# Patient Record
Sex: Male | Born: 1965 | Race: White | Hispanic: No | Smoking: Never smoker
Health system: Southern US, Community
[De-identification: ages and names within clinical notes are randomized; demographics above are authoritative.]

## PROBLEM LIST (undated history)

## (undated) DIAGNOSIS — J45909 Unspecified asthma, uncomplicated: Secondary | ICD-10-CM

## (undated) DIAGNOSIS — K5190 Ulcerative colitis, unspecified, without complications: Secondary | ICD-10-CM

---

## 2001-07-19 HISTORY — PX: TOTAL COLECTOMY: SHX852

## 2006-05-15 ENCOUNTER — Emergency Department: Payer: Self-pay | Admitting: Internal Medicine

## 2013-10-02 ENCOUNTER — Ambulatory Visit: Payer: Self-pay | Admitting: Family Medicine

## 2013-10-10 DIAGNOSIS — M4802 Spinal stenosis, cervical region: Secondary | ICD-10-CM | POA: Insufficient documentation

## 2015-04-29 DIAGNOSIS — J45909 Unspecified asthma, uncomplicated: Secondary | ICD-10-CM | POA: Insufficient documentation

## 2016-05-11 ENCOUNTER — Other Ambulatory Visit: Payer: Self-pay | Admitting: Family Medicine

## 2016-05-11 DIAGNOSIS — R1909 Other intra-abdominal and pelvic swelling, mass and lump: Secondary | ICD-10-CM

## 2016-05-17 ENCOUNTER — Ambulatory Visit
Admission: RE | Admit: 2016-05-17 | Discharge: 2016-05-17 | Disposition: A | Discharge status: Home / Self Care | Payer: BLUE CROSS/BLUE SHIELD | Source: Ambulatory Visit | Attending: Family Medicine | Admitting: Family Medicine

## 2016-05-17 ENCOUNTER — Ambulatory Visit: Admission: RE | Admit: 2016-05-17 | Payer: BLUE CROSS/BLUE SHIELD | Source: Ambulatory Visit

## 2016-05-17 DIAGNOSIS — K4090 Unilateral inguinal hernia, without obstruction or gangrene, not specified as recurrent: Secondary | ICD-10-CM | POA: Diagnosis not present

## 2016-05-17 DIAGNOSIS — R1909 Other intra-abdominal and pelvic swelling, mass and lump: Secondary | ICD-10-CM

## 2016-05-17 DIAGNOSIS — R1900 Intra-abdominal and pelvic swelling, mass and lump, unspecified site: Secondary | ICD-10-CM | POA: Diagnosis present

## 2017-07-26 DIAGNOSIS — R7302 Impaired glucose tolerance (oral): Secondary | ICD-10-CM | POA: Insufficient documentation

## 2017-07-26 DIAGNOSIS — D509 Iron deficiency anemia, unspecified: Secondary | ICD-10-CM | POA: Insufficient documentation

## 2018-01-09 ENCOUNTER — Emergency Department
Admission: EM | Admit: 2018-01-09 | Discharge: 2018-01-09 | Disposition: A | Discharge status: Home / Self Care | Payer: BLUE CROSS/BLUE SHIELD | Attending: Emergency Medicine | Admitting: Emergency Medicine

## 2018-01-09 ENCOUNTER — Encounter: Payer: Self-pay | Admitting: Emergency Medicine

## 2018-01-09 ENCOUNTER — Emergency Department: Payer: BLUE CROSS/BLUE SHIELD

## 2018-01-09 ENCOUNTER — Other Ambulatory Visit: Payer: Self-pay

## 2018-01-09 DIAGNOSIS — J45909 Unspecified asthma, uncomplicated: Secondary | ICD-10-CM | POA: Insufficient documentation

## 2018-01-09 DIAGNOSIS — I82432 Acute embolism and thrombosis of left popliteal vein: Secondary | ICD-10-CM | POA: Diagnosis not present

## 2018-01-09 DIAGNOSIS — R2242 Localized swelling, mass and lump, left lower limb: Secondary | ICD-10-CM | POA: Diagnosis present

## 2018-01-09 DIAGNOSIS — O2230 Deep phlebothrombosis in pregnancy, unspecified trimester: Secondary | ICD-10-CM

## 2018-01-09 HISTORY — DX: Unspecified asthma, uncomplicated: J45.909

## 2018-01-09 HISTORY — DX: Ulcerative colitis, unspecified, without complications: K51.90

## 2018-01-09 LAB — BASIC METABOLIC PANEL
Anion gap: 7 (ref 5–15)
BUN: 15 mg/dL (ref 6–20)
CO2: 24 mmol/L (ref 22–32)
CREATININE: 1.12 mg/dL (ref 0.61–1.24)
Calcium: 8.5 mg/dL — ABNORMAL LOW (ref 8.9–10.3)
Chloride: 110 mmol/L (ref 101–111)
GFR calc Af Amer: >60 mL/min (ref >60)
GLUCOSE: 95 mg/dL (ref 65–99)
Potassium: 4.2 mmol/L (ref 3.5–5.1)
SODIUM: 141 mmol/L (ref 135–145)

## 2018-01-09 LAB — CBC WITH DIFFERENTIAL/PLATELET
BASOS ABS: 0.1 10*3/uL (ref 0–0.1)
BASOS PCT: 1 %
Eosinophils Absolute: 0.1 10*3/uL (ref 0–0.7)
Eosinophils Relative: 1 %
HCT: 44.1 % (ref 40.0–52.0)
Hemoglobin: 15.1 g/dL (ref 13.0–18.0)
LYMPHS PCT: 27 %
Lymphs Abs: 2.2 10*3/uL (ref 1.0–3.6)
MCH: 30.1 pg (ref 26.0–34.0)
MCHC: 34.4 g/dL (ref 32.0–36.0)
MCV: 87.6 fL (ref 80.0–100.0)
MONO ABS: 0.7 10*3/uL (ref 0.2–1.0)
Monocytes Relative: 9 %
NEUTROS PCT: 62 %
Neutro Abs: 5.1 10*3/uL (ref 1.4–6.5)
Platelets: 288 10*3/uL (ref 150–440)
RBC: 5.03 MIL/uL (ref 4.40–5.90)
RDW: 15.7 % — AB (ref 11.5–14.5)
WBC: 8.2 10*3/uL (ref 3.8–10.6)

## 2018-01-09 LAB — PROTIME-INR
INR: 0.98
Prothrombin Time: 12.9 seconds (ref 11.4–15.2)

## 2018-01-09 MED ORDER — RIVAROXABAN (XARELTO) VTE STARTER PACK (15 & 20 MG): ORAL_TABLET | ORAL | 0 refills | Status: DC

## 2018-01-09 MED ORDER — RIVAROXABAN 15 MG PO TABS
15.0000 mg | ORAL_TABLET | Freq: Once | ORAL | Status: AC
  Administered 2018-01-09: 15 mg via ORAL
  Filled 2018-01-09: qty 1

## 2018-01-09 NOTE — Discharge Instructions (Addendum)
If you have any bleeding from your bottom, black stools, or you feel worse in any way including chest pain or shortness of breath return to the emergency room.  Follow up with your primary care doctor in the next 2 or 3 days. Avoid strenuous exercise until the swelling and pain abates.

## 2018-01-09 NOTE — ED Triage Notes (Signed)
C/O left lower leg swelling x 5 days.  Swelling improves at night with elevation.  Also c/o left calf pain.  States pain was initially behind left knee, but is now lower, toward ankle.

## 2018-01-09 NOTE — ED Provider Notes (Signed)
[facility] Emergency Department Provider Note  ____________________________________________   I have reviewed the triage vital signs and the nursing notes. Where available I have reviewed prior notes and, if possible and indicated, outside hospital notes.    HISTORY  Chief Complaint Leg Swelling    HPI Shaun Butler is a 52 y.o. male w hx of UC sp shin of his colon years ago, who states he has no recent GI bleeding but sometimes has mild spotting, presents today complaining of pain and swelling to the left lower extremity for a few days.  He states that he did go for a 3-day car trip prior to the swelling and pain starting.  He denies any chest pain or shortness of breath no personal family history of PE or DVT, the pain is uncomfortable but not significant enough to require narcotic pain medication.   Pain is in the left leg, nonradiating, nothing makes it better except for rest nothing makes it worse except for walking, no antecedent treatment.   Past Medical History:  Diagnosis Date  . Asthma   . Ulcerative colitis ([place])     There are no active problems to display for this patient.   Past Surgical History:  Procedure Laterality Date  . TOTAL COLECTOMY  2003    Prior to Admission medications   Medication Sig Start Date End Date Taking? Authorizing Provider  Rivaroxaban 15 & 20 MG TBPK Take as directed on package: Start with one 85m tablet by mouth twice a day with food. On Day 22, switch to one 279mtablet once a day with food. 01/09/18   McSchuyler AmorMD    Allergies Patient has no known allergies.  No family history on file.  Social History Social History   Tobacco Use  . Smoking status: Never Smoker  . Smokeless tobacco: Never Used  Substance Use Topics  . Alcohol use: Not on file  . Drug use: Not on file    Review of Systems Constitutional: No fever/chills Eyes: No visual changes. ENT: No sore throat. No stiff neck  no neck pain Cardiovascular: Denies chest pain. Respiratory: Denies shortness of breath. Gastrointestinal:   no vomiting.  No diarrhea.  No constipation. Genitourinary: Negative for dysuria. Musculoskeletal: HPI regarding lower extremity swelling Skin: Negative for rash. Neurological: Negative for severe headaches, focal weakness or numbness.   ____________________________________________   PHYSICAL EXAM:  VITAL SIGNS: ED Triage Vitals  Enc Vitals Group     BP 01/09/18 1217 117/68     Pulse Rate 01/09/18 1217 98     Resp 01/09/18 1217 16     Temp 01/09/18 1217 (!) 97.5 F (36.4 C)     Temp Source 01/09/18 1217 Oral     SpO2 01/09/18 1217 100 %     Weight 01/09/18 1215 162 lb (73.5 kg)     Height 01/09/18 1215 5' 6"  (1.676 m)     Head Circumference --      Peak Flow --      Pain Score 01/09/18 1214 4     Pain Loc --      Pain Edu? --      Excl. in [place]--     Constitutional: Alert and oriented. Well appearing and in no acute distress. Eyes: Conjunctivae are normal Head: Atraumatic HEENT: No congestion/rhinnorhea. Mucous membranes are moist.  Oropharynx non-erythematous Cardiovascular: Normal rate, regular rhythm. Grossly normal heart sounds.  Good peripheral circulation. Respiratory: Normal respiratory effort.  No retractions. Lungs CTAB.  Musculoskeletal: She with mild tenderness palpation the popliteal region going down to the left calf.  Strong distal pulses compartments are soft no crepitus, full range of motion, no significant swelling, normal color, no upper extremity tenderness. No joint effusions, distal pulses are intact and symmetric Neurologic:  Normal speech and language. No gross focal neurologic deficits are appreciated.  Skin:  Skin is warm, dry and intact. No rash noted. Psychiatric: Mood and affect are normal. Speech and behavior are normal.  ____________________________________________   LABS (all labs ordered are listed, but only abnormal results are  displayed)  Labs Reviewed  CBC WITH DIFFERENTIAL/PLATELET - Abnormal; Notable for the following components:      Result Value   RDW 15.7 (*)    All other components within normal limits  BASIC METABOLIC PANEL - Abnormal; Notable for the following components:   Calcium 8.5 (*)    All other components within normal limits  PROTIME-INR    Pertinent labs  results that were available during my care of the patient were reviewed by me and considered in my medical decision making (see chart for details). ____________________________________________  EKG  I personally interpreted any EKGs ordered by me or triage  ____________________________________________  RADIOLOGY  Pertinent labs & imaging results that were available during my care of the patient were reviewed by me and considered in my medical decision making (see chart for details). If possible, patient and/or family made aware of any abnormal findings.  US Venous Img Lower Unilateral Left  Result Date: 01/09/2018 CLINICAL DATA:  Left leg swelling and calf pain for 5 days EXAM: LEFT LOWER EXTREMITY VENOUS DUPLEX ULTRASOUND TECHNIQUE: Doppler venous assessment of the left lower extremity deep venous system was performed, including characterization of spectral flow, compressibility, and phasicity. COMPARISON:  None. FINDINGS: There is complete compressibility of the common femoral and femoral veins. Doppler analysis demonstrates respiratory phasicity. The popliteal vein is noncompressible. It is thrombosed and contains occlusive thrombus. The posterior tibial and peroneal veins are also noncompressible and contain occlusive thrombus. There is no obvious superficial vein thrombosis. IMPRESSION: The study is positive for occlusive DVT in the left popliteal vein. Electronically Signed   By: Marybelle Killings M.D.   On: 01/09/2018 13:59   ____________________________________________    PROCEDURES  Procedure(s) performed:  None  Procedures  Critical Care performed: None  ____________________________________________   INITIAL IMPRESSION / ASSESSMENT AND PLAN / ED COURSE  Pertinent labs & imaging results that were available during my care of the patient were reviewed by me and considered in my medical decision making (see chart for details).  Patient with DVT popliteal, discussed with Dr. Delana Meyer of vascular surgery, patient will be placed on Xarelto, and we will have him follow close with primary care doctor return percussion swab given and understood.  Patient has some slightly increased risk of rectal bleeding as he does have a history of colectomy, weighing the risks and benefits of anticoagulation versus not anticoagulation, at this time I think it is better to anticoagulate him.  He is made aware however of the need to return for any bleeding or black stool etc.  Patient advised to come back if he has chest pain or shortness of breath or any other concerns.  He does not have phlegmasia dolens, and is not candidate for intravascular intervention according to vascular surgery which I agree with.  Patient counseled about activity etc.   ____________________________________________   FINAL CLINICAL IMPRESSION(S) / ED DIAGNOSES  Final diagnoses:  DVT (deep  vein thrombosis) in [facility])      This chart was dictated using voice recognition software.  Despite best efforts to proofread,  errors can occur which can change meaning.      Schuyler Amor, MD 01/09/18 [phone]

## 2018-04-27 ENCOUNTER — Other Ambulatory Visit: Payer: Self-pay | Admitting: Nurse Practitioner

## 2018-04-27 DIAGNOSIS — I824Y9 Acute embolism and thrombosis of unspecified deep veins of unspecified proximal lower extremity: Secondary | ICD-10-CM

## 2018-04-27 DIAGNOSIS — M79605 Pain in left leg: Secondary | ICD-10-CM

## 2018-05-02 ENCOUNTER — Encounter (INDEPENDENT_AMBULATORY_CARE_PROVIDER_SITE_OTHER): Payer: Self-pay

## 2018-05-02 ENCOUNTER — Ambulatory Visit
Admission: RE | Admit: 2018-05-02 | Discharge: 2018-05-02 | Disposition: A | Discharge status: Home / Self Care | Payer: BLUE CROSS/BLUE SHIELD | Source: Ambulatory Visit | Attending: Nurse Practitioner | Admitting: Nurse Practitioner

## 2018-05-02 DIAGNOSIS — I824Y9 Acute embolism and thrombosis of unspecified deep veins of unspecified proximal lower extremity: Secondary | ICD-10-CM

## 2018-05-02 DIAGNOSIS — M79605 Pain in left leg: Secondary | ICD-10-CM

## 2018-05-02 DIAGNOSIS — I82532 Chronic embolism and thrombosis of left popliteal vein: Secondary | ICD-10-CM | POA: Insufficient documentation

## 2018-09-08 ENCOUNTER — Encounter (INDEPENDENT_AMBULATORY_CARE_PROVIDER_SITE_OTHER): Payer: Self-pay | Admitting: Family Medicine

## 2018-09-08 ENCOUNTER — Ambulatory Visit (INDEPENDENT_AMBULATORY_CARE_PROVIDER_SITE_OTHER): Payer: BLUE CROSS/BLUE SHIELD | Admitting: Family Medicine

## 2018-09-08 DIAGNOSIS — M542 Cervicalgia: Secondary | ICD-10-CM

## 2018-09-08 MED ORDER — GABAPENTIN 100 MG PO CAPS: ORAL_CAPSULE | ORAL | 3 refills | Status: DC

## 2018-09-08 MED ORDER — METHOCARBAMOL 750 MG PO TABS: 750.0000 mg | ORAL_TABLET | Freq: Four times a day (QID) | ORAL | 3 refills | Status: AC | PRN

## 2018-09-08 MED ORDER — PREDNISONE 10 MG PO TABS: ORAL_TABLET | ORAL | 0 refills | Status: DC

## 2018-09-08 NOTE — Progress Notes (Signed)
  Shaun Butler [place] - 53 y.o. male MRN [id number]  Date of birth: Oct 13, 1965    SUBJECTIVE:      Chief Complaint: neck pain  HPI:  53 year old male with history of neck pain with radiculopathy presents with neck pain.  Initially his symptoms began about 5 years ago.  Surgery was recommended that time.  However, he decided to pursue nonsurgical options and has done well with exercise and chiropractic care.  He had been asymptomatic for several years but now his pain returned about 1.5 weeks ago.  He states he was doing decline push-ups at the gym and shortly after developed tightness and spasm of his left trapezius.  With this, he also developed left-sided neck pain and burning burning nerve type pain into the lateral left arm down to the elbow as well as into the hand and middle finger of the left arm.  At this time, he has had some improvement in the trapezius spasm but the arm pain has persisted.  He has mild temporary benefit with ibuprofen and Tylenol.  Symptoms are worse with rotation of the neck to the right or with lateral bending to the left.  He denies any focal weakness.  He does note some numbness at times in the palm.  No erythema or bruising.   ROS:     See HPI  PERTINENT  PMH / PSH FH / / SH:  Past Medical, Surgical, Social, and Family History Reviewed & Updated in the EMR.  Pertinent findings include:  History of cervical radiculopathy DVT  OBJECTIVE: There were no vitals taken for this visit.  Physical Exam:  Vital signs are reviewed.  GEN: Alert and oriented, NAD Pulm: Breathing unlabored PSY: normal mood, congruent affect  MSK: Cervical spine: No obvious deformity or erythema Tenderness over the left paraspinal/trapezius Slightly limited range of motion with rotation to the right and extension. N/V intact distally 5/5 strength in the bilateral biceps, triceps, wrist, and intrinsic hand muscles 1+ reflexes in biceps, triceps, and brachioradialis symmetric  bilaterally  Shoulder exam: Full range of motion without pain of the bilateral shoulders 5/5 strength with rotator cuff testing bilaterally  ASSESSMENT & PLAN:  1.  Neck pain with radiculopathy- patient has mixture of trapezius spasm which is resolving as well as neuropathic pain from likely nerve root impingement with symptoms most consistent in the C5 distribution.  He did have some previous symptoms in the C7 distribution which show primarily resolved at this point.  Previous MRI from 2015 shows nerve root impingement at C5/C6 - Patient will continue with chiropractic care - Gabapentin -Robaxin for muscle positive -Prednisone taper - We will follow-up with if his symptoms are failing to improve.  Consider repeat MRI and referral for ESI at that time. Additionally,  Pt will be scheduled to follow up to establish primary care.

## 2018-09-08 NOTE — Progress Notes (Signed)
I saw and examined the patient with Dr. Okey Dupre and agree with assessment and plan as outlined.  Left arm radicular pain.  Will treat with prednisone, possibly robaxin and gabapentin.  Continue with chiropractic per Dr. Noberto Retort.  If fails to improve, ESI.  If still no relief, new MRI.  Also wants to establish primary care.  History of DVT, treated successfully.  Cause unknown.  Otherwise in good health.

## 2018-09-22 ENCOUNTER — Ambulatory Visit (INDEPENDENT_AMBULATORY_CARE_PROVIDER_SITE_OTHER): Payer: BLUE CROSS/BLUE SHIELD | Admitting: Family Medicine

## 2018-09-22 ENCOUNTER — Encounter (INDEPENDENT_AMBULATORY_CARE_PROVIDER_SITE_OTHER): Payer: Self-pay | Admitting: Family Medicine

## 2018-09-22 DIAGNOSIS — M542 Cervicalgia: Secondary | ICD-10-CM | POA: Diagnosis not present

## 2018-09-22 MED ORDER — METHYLPREDNISOLONE 4 MG PO TBPK: ORAL_TABLET | ORAL | 0 refills | Status: AC

## 2018-09-22 NOTE — Progress Notes (Signed)
   Office Visit Note   Patient: Shaun Butler           Date of Birth: October 16, 1965           MRN: [id number] Visit Date: 09/22/2018 Requested by: No referring provider defined for this encounter. PCP: System, Pcp Not In  Subjective: Chief Complaint  Patient presents with  . Neck - Pain    Pain has worsened with "nerve pain" in the left arm. Woke him from sleep last night.    HPI: He is here with worsening neck and left arm pain.  Prednisone helped temporarily, but as soon as he finished the pain became intense.  Now he cannot sleep at night.  He has a constant tingling in his hand.              ROS: Otherwise noncontributory  Objective: Vital Signs: There were no vitals taken for this visit.  Physical Exam:  Neck: Positive Spurling's test to the left.  Tender near the C7 spinous process.  Upper extremity strength and reflexes are normal except for wrist extension which is 4/5 on the left.  Imaging: None today.  Assessment & Plan: 1.  Worsening neck and left arm pain with wrist extension weakness -New MRI scan.  6-day Medrol Dosepak.  Cervical epidural injection depending on MRI findings.  Surgery if fails conservative management.     Procedures: No procedures performed  No notes on file     PMFS History: Patient Active Problem List   Diagnosis Date Noted  . IGT (impaired glucose tolerance) 07/26/2017  . Microcytic hypochromic anemia 07/26/2017  . Asthma without status asthmaticus 04/29/2015   Past Medical History:  Diagnosis Date  . Asthma   . Ulcerative colitis ([place])     History reviewed. No pertinent family history.  Past Surgical History:  Procedure Laterality Date  . TOTAL COLECTOMY  2003   Social History   Occupational History  . Not on file  Tobacco Use  . Smoking status: Never Smoker  . Smokeless tobacco: Never Used  Substance and Sexual Activity  . Alcohol use: Not on file  . Drug use: Not on file  . Sexual activity: Not on file

## 2018-09-26 ENCOUNTER — Ambulatory Visit
Admission: RE | Admit: 2018-09-26 | Discharge: 2018-09-26 | Disposition: A | Discharge status: Home / Self Care | Payer: BLUE CROSS/BLUE SHIELD | Source: Ambulatory Visit | Attending: Family Medicine | Admitting: Family Medicine

## 2018-09-26 ENCOUNTER — Telehealth (INDEPENDENT_AMBULATORY_CARE_PROVIDER_SITE_OTHER): Payer: Self-pay | Admitting: Family Medicine

## 2018-09-26 DIAGNOSIS — M542 Cervicalgia: Secondary | ICD-10-CM

## 2018-09-26 DIAGNOSIS — M4802 Spinal stenosis, cervical region: Secondary | ICD-10-CM

## 2018-09-26 NOTE — Telephone Encounter (Signed)
I spoke to the patient about his MRI results.  MRI shows moderate spinal stenosis at C3-4, moderate to severe at C4-5, and severe at C5-6.  I think it would benefit him to consult with neurosurgery for consideration of fusion or possibly disc replacement.  I am concerned about danger of paralysis if he were to get in a whiplash type accident.  Patient is in agreement with this plan so I will arrange consultation for him.

## 2018-10-02 ENCOUNTER — Telehealth (INDEPENDENT_AMBULATORY_CARE_PROVIDER_SITE_OTHER): Payer: Self-pay | Admitting: Family Medicine

## 2018-10-02 MED ORDER — GABAPENTIN 300 MG PO CAPS: 300.0000 mg | ORAL_CAPSULE | Freq: Every day | ORAL | 6 refills | Status: DC

## 2018-10-02 NOTE — Telephone Encounter (Signed)
The referral process to Dr. Ellene Route has been started - that office will be calling the patient. Is he to get the [facility] also, possibly before he sees the neurosurgeon?

## 2018-10-02 NOTE — Telephone Encounter (Signed)
Whichever the patient prefers is fine.

## 2018-10-02 NOTE — Telephone Encounter (Signed)
I called the patient. He has an [facility] appointment for 10/19/2018 with Dr. Ernestina Patches - he is on the cancellation list also, though. He is requesting a refill on the gabapentin. It is working well for him, but he is having to take 3 of the 100 mg qhs , though.

## 2018-10-02 NOTE — Telephone Encounter (Signed)
I called and left a full message on the patient's voice mail - new Rx has been sent in to his pharmacy. The strength has been changed to 300 mg, so he only needs to take 1 qhs. He is to call us back if he has any questions on this or the earlier conversation.

## 2018-10-02 NOTE — Telephone Encounter (Signed)
Pt called in said he wants to speak to Dr.Hilts or Terri in regards to getting clarification on what's suppose to be happening next. Pt thought after he had the appt with Dr.Hilts to review the mri that he was going to be referred to a Neurosurgeon but he said he received a call from Dr.Newton to schedule an appt for an injection.

## 2018-10-02 NOTE — Telephone Encounter (Signed)
New Rx sent for 300 mg pills.

## 2018-10-09 ENCOUNTER — Other Ambulatory Visit (INDEPENDENT_AMBULATORY_CARE_PROVIDER_SITE_OTHER): Payer: Self-pay | Admitting: Family Medicine

## 2018-10-09 MED ORDER — GABAPENTIN 300 MG PO CAPS: 300.0000 mg | ORAL_CAPSULE | Freq: Every day | ORAL | 3 refills | Status: AC

## 2018-10-19 ENCOUNTER — Encounter (INDEPENDENT_AMBULATORY_CARE_PROVIDER_SITE_OTHER): Payer: Self-pay | Admitting: Physical Medicine and Rehabilitation

## 2019-03-23 ENCOUNTER — Telehealth: Payer: Self-pay

## 2019-03-23 MED ORDER — AZITHROMYCIN 250 MG PO TABS: ORAL_TABLET | ORAL | 0 refills | Status: AC

## 2019-03-23 NOTE — Telephone Encounter (Signed)
Sent!

## 2019-03-23 NOTE — Telephone Encounter (Signed)
Patient called. Would like Dr. Junius Roads to send a RX for a Sinus Infection. Call back number [phone]

## 2019-03-23 NOTE — Telephone Encounter (Signed)
I called to get more information. He has had symptoms x 3 days: pressure behind left eye, fullness in right ear, postnasal drainage, clear discharge from nose. No fever. No cough/chest congestion. H/o sinus infections - "would like to get ahead of this one." He has tried OTC meds: Vicks and "allergy relief" pills. He is requesting an antibiotic. Please advise if he needs ov or medication. Uses [facility].

## 2019-03-23 NOTE — Addendum Note (Signed)
Addended by: Hortencia Pilar on: 03/23/2019 11:52 AM   Modules accepted: Orders

## 2019-03-23 NOTE — Telephone Encounter (Signed)
I called and advised the patient a zpack was sent in for him. He should let us know if he fails to get better or worsens.

## 2019-05-03 ENCOUNTER — Telehealth: Payer: Self-pay | Admitting: Family Medicine

## 2019-05-03 NOTE — Telephone Encounter (Signed)
Patient called. He would advair 100/50 called in. His call back number is [phone]

## 2019-05-03 NOTE — Telephone Encounter (Signed)
Please advise 

## 2019-05-04 MED ORDER — FLUTICASONE-SALMETEROL 100-50 MCG/DOSE IN AEPB: 1.0000 | INHALATION_SPRAY | Freq: Two times a day (BID) | RESPIRATORY_TRACT | 6 refills | Status: DC

## 2019-05-04 NOTE — Addendum Note (Signed)
Addended by: Hortencia Pilar on: 05/04/2019 08:04 AM   Modules accepted: Orders

## 2019-05-04 NOTE — Telephone Encounter (Signed)
Rx sent 

## 2019-08-21 ENCOUNTER — Other Ambulatory Visit: Payer: Self-pay | Admitting: Nurse Practitioner

## 2019-08-21 ENCOUNTER — Ambulatory Visit
Admission: RE | Admit: 2019-08-21 | Discharge: 2019-08-21 | Disposition: A | Discharge status: Home / Self Care | Payer: BC Managed Care – PPO | Source: Ambulatory Visit | Attending: Nurse Practitioner | Admitting: Nurse Practitioner

## 2019-08-21 ENCOUNTER — Other Ambulatory Visit: Payer: Self-pay

## 2019-08-21 DIAGNOSIS — M25562 Pain in left knee: Secondary | ICD-10-CM

## 2019-08-21 DIAGNOSIS — Z86718 Personal history of other venous thrombosis and embolism: Secondary | ICD-10-CM

## 2019-11-21 ENCOUNTER — Other Ambulatory Visit: Payer: Self-pay | Admitting: Family Medicine

## 2020-04-13 IMAGING — US US EXTREM LOW VENOUS*L*
1 series · 13 of 24 positions shown · non-contrast
Comparison: None.

CLINICAL DATA: Acute left knee pain and edema



[Series 1: us extrem low venous*left* · 13 of 40 slices shown]
[im 1/40]
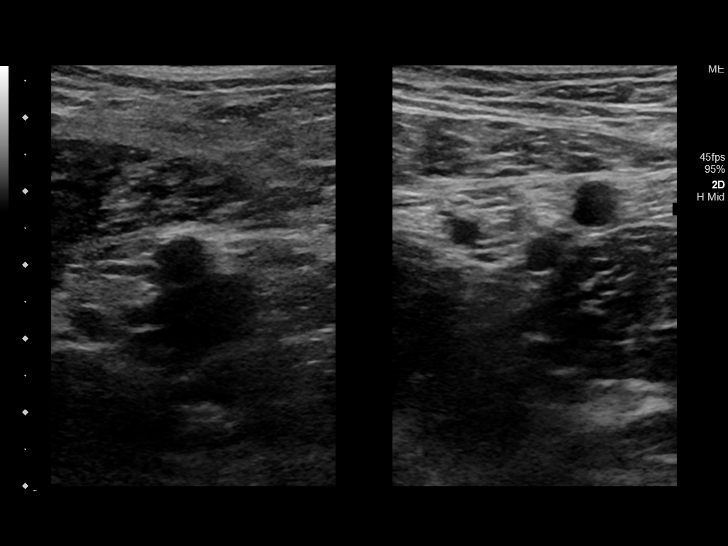
[im 4/40]
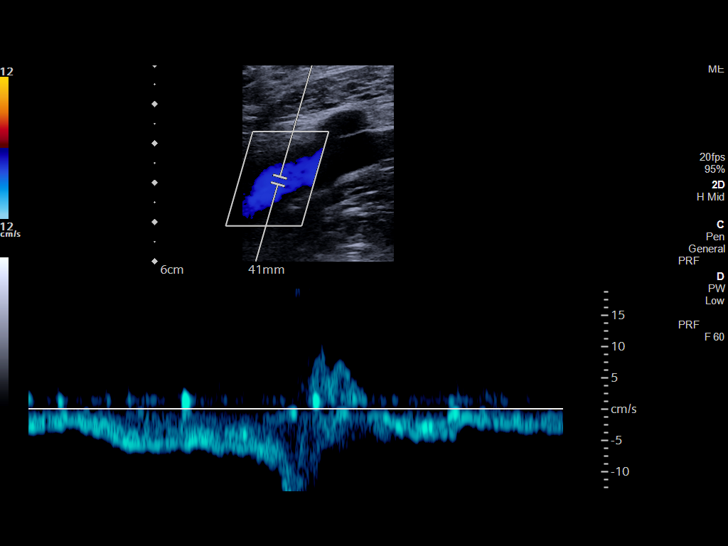
[im 7/40]
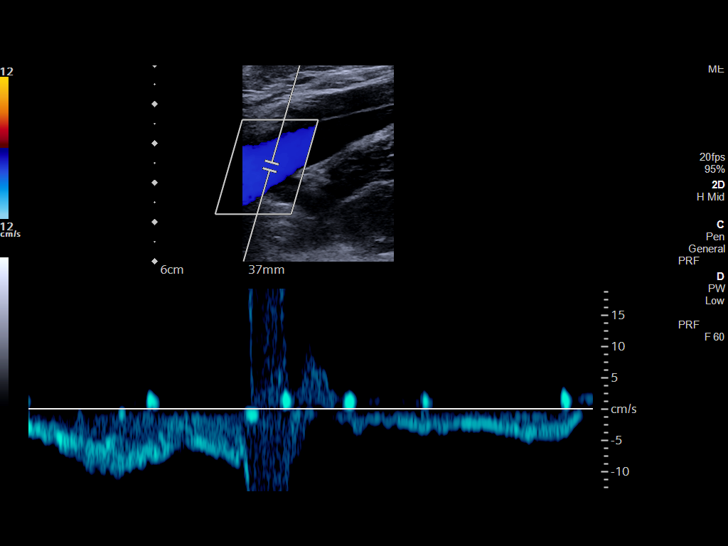
[im 11/40]
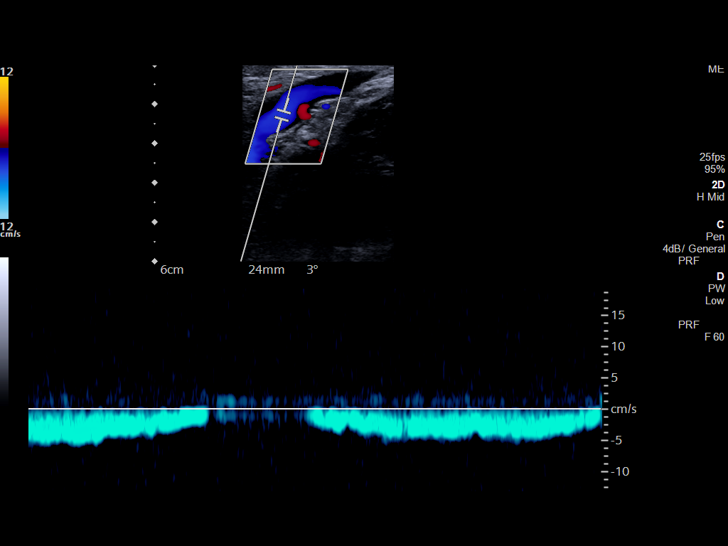
[im 14/40]
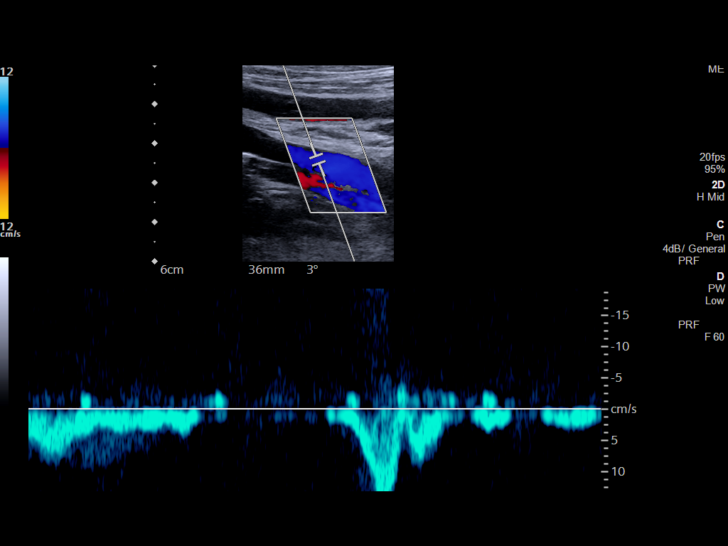
[im 17/40]
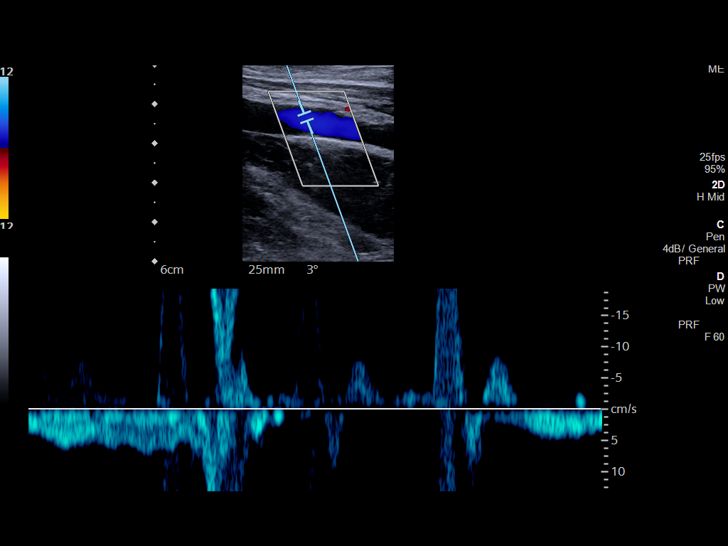
[im 21/40]
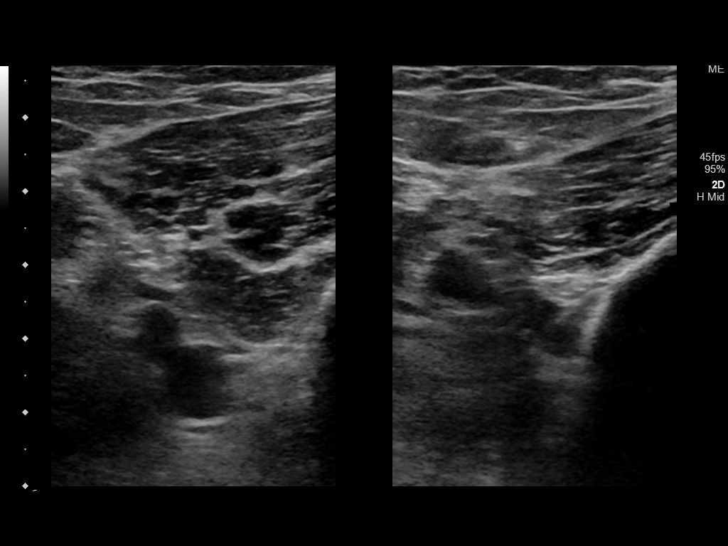
[im 23/40]
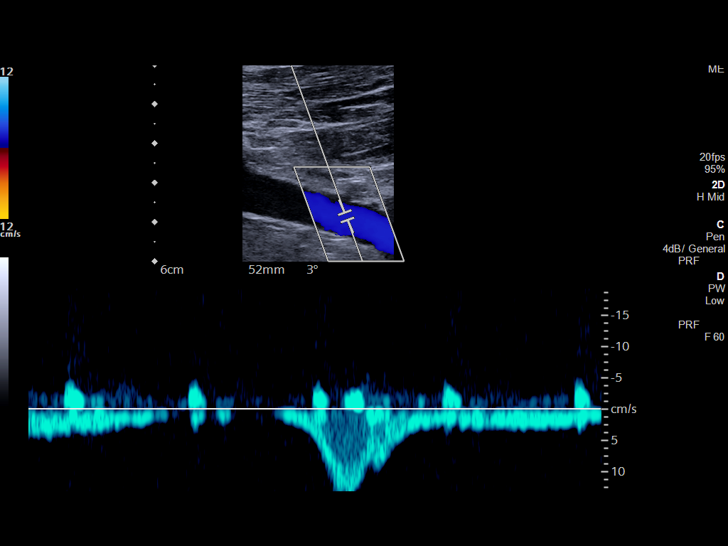
[im 26/40]
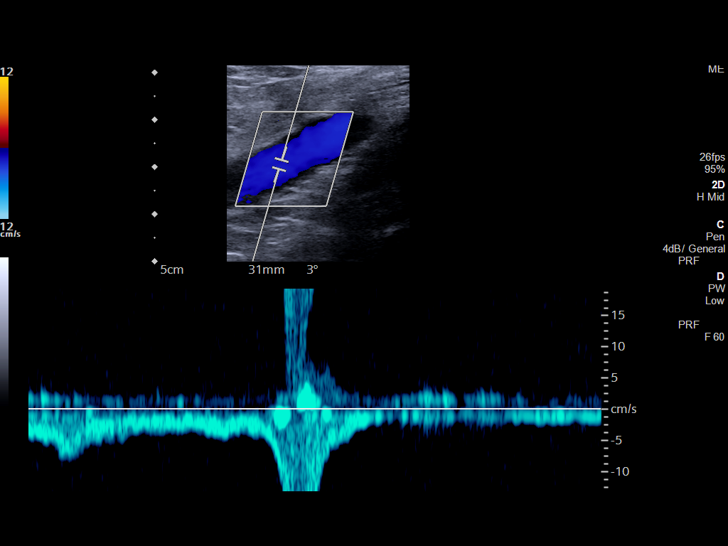
[im 29/40]
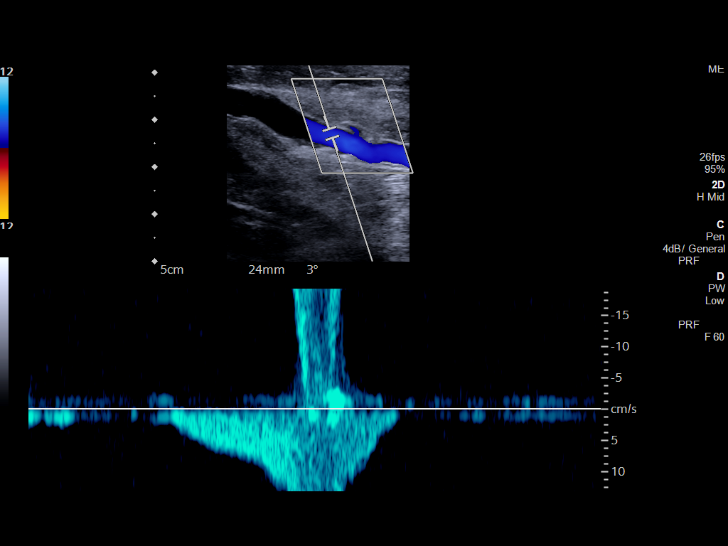
[im 33/40]
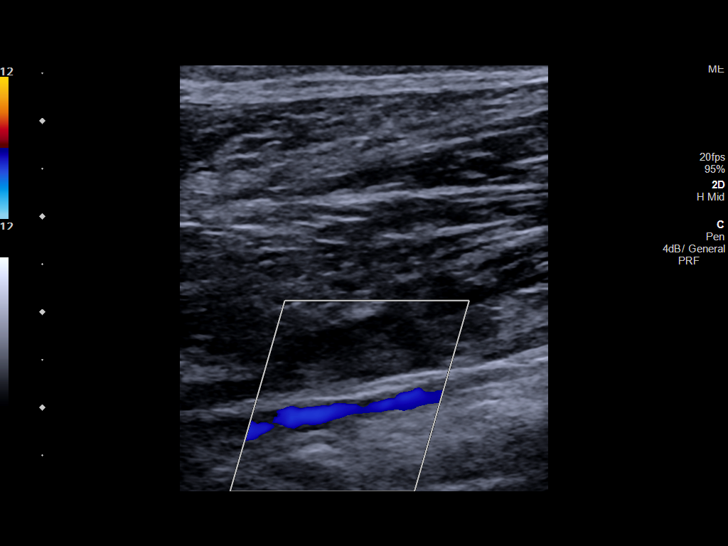
[im 36/40]
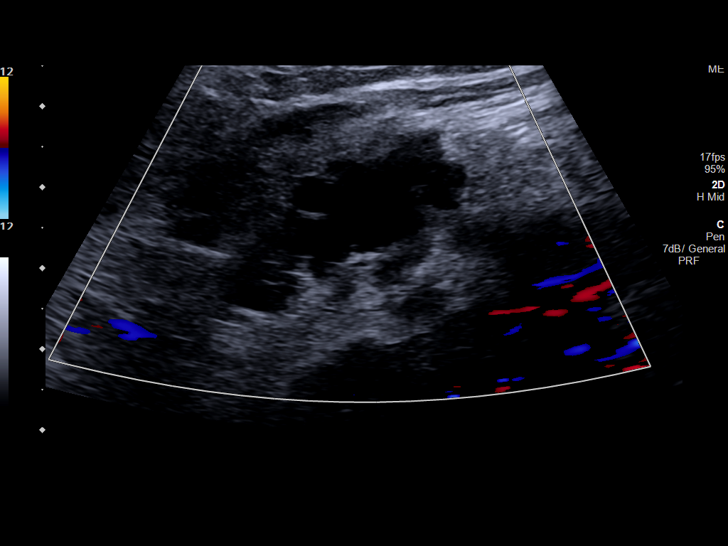
[im 40/40]
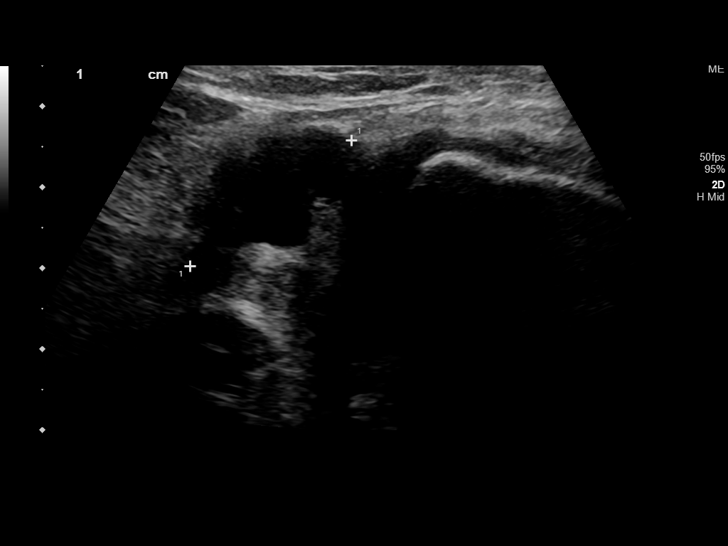

[13 of 24 positions shown; findings below may reference images not displayed]

FINDINGS: Contralateral Common Femoral Vein: Respiratory phasicity is normal
and symmetric with the symptomatic side. No evidence of thrombus.
Normal compressibility.

Common Femoral Vein: No evidence of thrombus. Normal
compressibility, respiratory phasicity and response to augmentation.

Saphenofemoral Junction: No evidence of thrombus. Normal
compressibility and flow on color Doppler imaging.

Profunda Femoral Vein: No evidence of thrombus. Normal
compressibility and flow on color Doppler imaging.

Femoral Vein: No evidence of thrombus. Normal compressibility,
respiratory phasicity and response to augmentation.

Popliteal Vein: No evidence of thrombus. Normal compressibility,
respiratory phasicity and response to augmentation.

Calf Veins: No evidence of thrombus. Normal compressibility and flow
on color Doppler imaging.

Venous Reflux:  Not assessed

Other Findings: Right medial knee area of pain demonstrates complex
irregular hypoechoic cystic area or fluid measuring 3.3 x 1.3 x
cm. This may represent a soft tissue injury/hematoma or possibly a
component of right knee joint effusion.
IMPRESSION: Negative for left lower extremity DVT.

Medial left knee abnormality as above suspicious for soft tissue
injury/hematoma or possibly component of right knee joint effusion.

## 2020-12-11 ENCOUNTER — Ambulatory Visit
Admission: RE | Admit: 2020-12-11 | Discharge: 2020-12-11 | Disposition: A | Discharge status: Home / Self Care | Payer: BC Managed Care – PPO | Source: Ambulatory Visit | Attending: Gerontology | Admitting: Gerontology

## 2020-12-11 ENCOUNTER — Other Ambulatory Visit: Payer: Self-pay | Admitting: Gerontology

## 2020-12-11 ENCOUNTER — Other Ambulatory Visit: Payer: Self-pay

## 2020-12-11 DIAGNOSIS — M79605 Pain in left leg: Secondary | ICD-10-CM

## 2020-12-11 DIAGNOSIS — I83893 Varicose veins of bilateral lower extremities with other complications: Secondary | ICD-10-CM | POA: Insufficient documentation

## 2020-12-11 DIAGNOSIS — I739 Peripheral vascular disease, unspecified: Secondary | ICD-10-CM | POA: Insufficient documentation

## 2020-12-11 DIAGNOSIS — M722 Plantar fascial fibromatosis: Secondary | ICD-10-CM | POA: Insufficient documentation

## 2021-02-03 ENCOUNTER — Other Ambulatory Visit (INDEPENDENT_AMBULATORY_CARE_PROVIDER_SITE_OTHER): Payer: Self-pay | Admitting: Nurse Practitioner

## 2021-02-03 DIAGNOSIS — I83892 Varicose veins of left lower extremities with other complications: Secondary | ICD-10-CM

## 2021-02-04 ENCOUNTER — Other Ambulatory Visit: Payer: Self-pay

## 2021-02-04 ENCOUNTER — Ambulatory Visit (INDEPENDENT_AMBULATORY_CARE_PROVIDER_SITE_OTHER): Payer: BC Managed Care – PPO

## 2021-02-04 ENCOUNTER — Encounter (INDEPENDENT_AMBULATORY_CARE_PROVIDER_SITE_OTHER): Payer: Self-pay | Admitting: Nurse Practitioner

## 2021-02-04 ENCOUNTER — Ambulatory Visit (INDEPENDENT_AMBULATORY_CARE_PROVIDER_SITE_OTHER): Payer: BC Managed Care – PPO | Admitting: Nurse Practitioner

## 2021-02-04 VITALS — BP 124/76 | HR 60 | Ht 67.0 in | Wt 179.0 lb

## 2021-02-04 DIAGNOSIS — I739 Peripheral vascular disease, unspecified: Secondary | ICD-10-CM

## 2021-02-04 DIAGNOSIS — M722 Plantar fascial fibromatosis: Secondary | ICD-10-CM

## 2021-02-04 DIAGNOSIS — I83892 Varicose veins of left lower extremities with other complications: Secondary | ICD-10-CM

## 2021-02-04 DIAGNOSIS — I83893 Varicose veins of bilateral lower extremities with other complications: Secondary | ICD-10-CM | POA: Diagnosis not present

## 2021-02-04 NOTE — Progress Notes (Signed)
Subjective:    Patient ID: Shaun Butler, male    DOB: Dec 13, 1965, 55 y.o.   MRN: [id number] Chief Complaint  Patient presents with   New Patient (Initial Visit)    [place] VV of L Leg with edema  BLE ven reflux    Shaun Butler is a 55 year old male that presents today after referral from [place], NP.  The referral references swelling of the left lower extremity with varicose veins.  The patient had a DVT about 3 years ago in the popliteal area.  Following this DVT was on anticoagulation for a year.  Then approximately a year later he had a Baker's cyst in that same leg.  He received a cortisone shot which subsequently resulted in swelling of the left foot that eventually went away.  Then approximately several weeks ago the same thing happened.  He received a cortisone injection which helped but he did have swelling of the left foot and leg immediately after.  He had a DVT study done which was negative for any DVT.  However the patient notes that his primary care was very concerned about poor circulation.  Noting that there are portions of his lower extremity without here.  The patient is not completely hairless on his lower extremities however.  Only portions of his calf are.  The patient actually has hair on his feet as well as his toes.  The skin is not better shiny.  He denies any true claudication-like symptoms.  His feet are warm bilaterally.  With good pulses as well.  Today his noninvasive studies show no evidence of DVT.  No evidence of superficial thrombophlebitis.  No evidence of deep venous insufficiency or superficial venous reflux seen in the left lower extremity.   Review of Systems  Cardiovascular:  Positive for leg swelling.  Musculoskeletal:  Positive for myalgias.  All other systems reviewed and are negative.     Objective:   Physical Exam Vitals reviewed.  HENT:     Head: Normocephalic.  Cardiovascular:     Rate and Rhythm: Normal rate.     Pulses: Normal  pulses.          Dorsalis pedis pulses are 2+ on the right side and 2+ on the left side.       Posterior tibial pulses are 2+ on the right side and 2+ on the left side.  Pulmonary:     Effort: Pulmonary effort is normal.  Skin:    General: Skin is warm and dry.     Capillary Refill: Capillary refill takes less than 2 seconds.  Neurological:     Mental Status: He is alert and oriented to person, place, and time.  Psychiatric:        Mood and Affect: Mood normal.        Behavior: Behavior normal.        Thought Content: Thought content normal.        Judgment: Judgment normal.    BP 124/76   Pulse 60   Ht 5' 7"  (1.702 m)   Wt 179 lb (81.2 kg)   BMI 28.04 kg/m   Past Medical History:  Diagnosis Date   Asthma    Ulcerative colitis ([place])     Social History   Socioeconomic History   Marital status: Divorced    Spouse name: Not on file   Number of children: Not on file   Years of education: Not on file   Highest education level: Not on file  Occupational History   Not on file  Tobacco Use   Smoking status: Never   Smokeless tobacco: Never  Substance and Sexual Activity   Alcohol use: Not on file   Drug use: Not on file   Sexual activity: Not on file  Other Topics Concern   Not on file  Social History Narrative   Not on file   Social Determinants of Health   Financial Resource Strain: Not on file  Food Insecurity: Not on file  Transportation Needs: Not on file  Physical Activity: Not on file  Stress: Not on file  Social Connections: Not on file  Intimate Partner Violence: Not on file    Past Surgical History:  Procedure Laterality Date   TOTAL COLECTOMY  2003    History reviewed. No pertinent family history.  No Known Allergies  CBC Latest Ref Rng & Units 01/09/2018  WBC 3.8 - 10.6 K/uL 8.2  Hemoglobin 13.0 - 18.0 g/dL 15.1  Hematocrit 40.0 - 52.0 % 44.1  Platelets 150 - 440 K/uL 288      CMP     Component Value Date/Time   NA 141 01/09/2018  1419   K 4.2 01/09/2018 1419   CL 110 01/09/2018 1419   CO2 24 01/09/2018 1419   GLUCOSE 95 01/09/2018 1419   BUN 15 01/09/2018 1419   CREATININE 1.12 01/09/2018 1419   CALCIUM 8.5 (L) 01/09/2018 1419   GFRNONAA >60 01/09/2018 1419   GFRAA >60 01/09/2018 1419     No results found.     Assessment & Plan:   1. PVD (peripheral vascular disease) ([place]) After discussion with the patient he notes that his primary provider was more concerned with possible issues with poor circulation.  Based on the orders that were sent we proceeded with venous studies however not arterial studies.  However in discussion with the patient he does not have any symptoms of arterial disease such as claudication or rest pain symptoms.  With physical exam the patient does not have shiny taut hairless skin.  He has hair on both legs as well as his feet as well as his toes.  Both of his feet are warm with good bounding pulses.  Based on his lack of symptoms as well as the physical exam today it is unlikely that the patient has any significant arterial disease or issues with circulation.  An ABI was offered however the patient felt relieved by discussion of physical exam and symptoms.  They will follow-up with [place] on an as-needed basis.  2. Varicose veins of leg with edema, bilateral Noninvasive studies show no evidence of DVT or superficial venous reflux seen in his lower extremities today.  Some of the edema may be due to the [place] fasciitis recent interventions.    3. Plantar fasciitis, bilateral This is the likely cause for the patient's foot discomfort.  Patient does have an upcoming referral to podiatry for further work-up and evaluation.   Current Outpatient Medications on File Prior to Visit  Medication Sig Dispense Refill   albuterol (PROVENTIL HFA;VENTOLIN HFA) 108 (90 Base) MCG/ACT inhaler Inhale into the lungs.     azithromycin (ZITHROMAX) 250 MG tablet 2 PO x 1 day, then 1 PO qd x 4 more days. 6 each 0    calcium carbonate (OS-CAL - DOSED IN MG OF ELEMENTAL CALCIUM) 1250 (500 Ca) MG tablet Take by mouth.     cyanocobalamin 1000 MCG tablet Take by mouth.     ferrous sulfate 325 (65 FE)  MG tablet Take by mouth.     fluticasone (FLONASE) 50 MCG/ACT nasal spray Place into the nose.     fluticasone (FLONASE) 50 MCG/ACT nasal spray Place into the nose.     gabapentin (NEURONTIN) 300 MG capsule Take 1 capsule (300 mg total) by mouth at bedtime. 90 capsule 3   loratadine (CLARITIN) 10 MG tablet Take by mouth.     loratadine (CLARITIN) 10 MG tablet Take by mouth.     meloxicam (MOBIC) 15 MG tablet Take by mouth.     methocarbamol (ROBAXIN) 750 MG tablet Take 1 tablet (750 mg total) by mouth every 6 (six) hours as needed for muscle spasms. 60 tablet 3   methylPREDNISolone (MEDROL DOSEPAK) 4 MG TBPK tablet As directed for 6 days. 21 tablet 0   Multiple Vitamins-Minerals (EMERGEN-C VITAMIN C) PACK Take by mouth.     vitamin B-12 (CYANOCOBALAMIN) 1000 MCG tablet Take by mouth.     WIXELA INHUB 100-50 MCG/DOSE AEPB TAKE 1 PUFF BY MOUTH TWICE A DAY 60 each 6   No current facility-administered medications on file prior to visit.    There are no Patient Instructions on file for this visit. No follow-ups on file.   Kris Hartmann, NP

## 2021-08-04 IMAGING — US US EXTREM LOW VENOUS*L*
1 series · 13 of 24 positions shown · non-contrast
Comparison: August 21, 2019.

CLINICAL DATA: Left leg pain



[Series 1: us venous img lower uni left (dvt) · portal-venous · 13 of 42 slices shown]
[im 1/42]
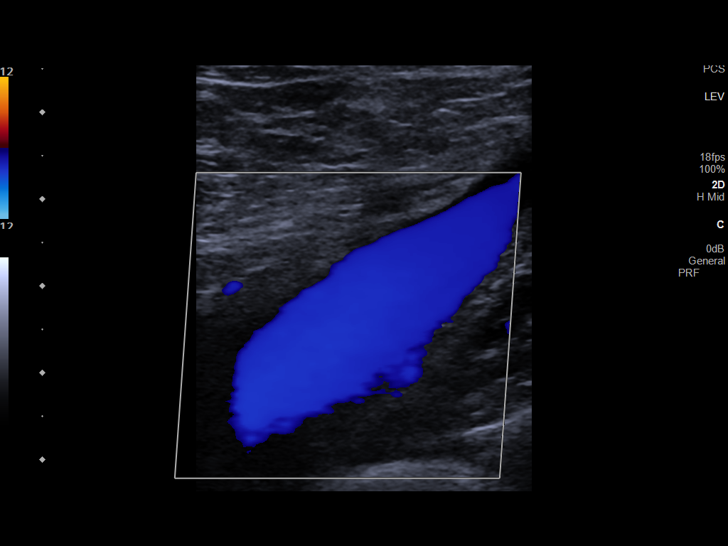
[im 4/42]
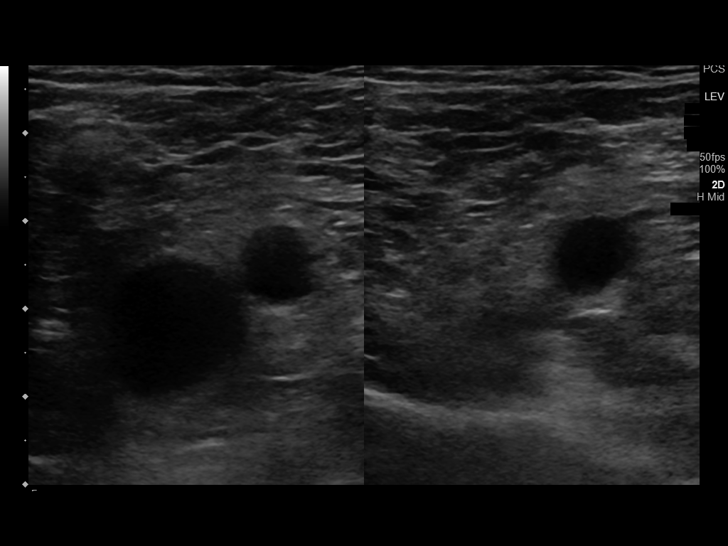
[im 8/42]
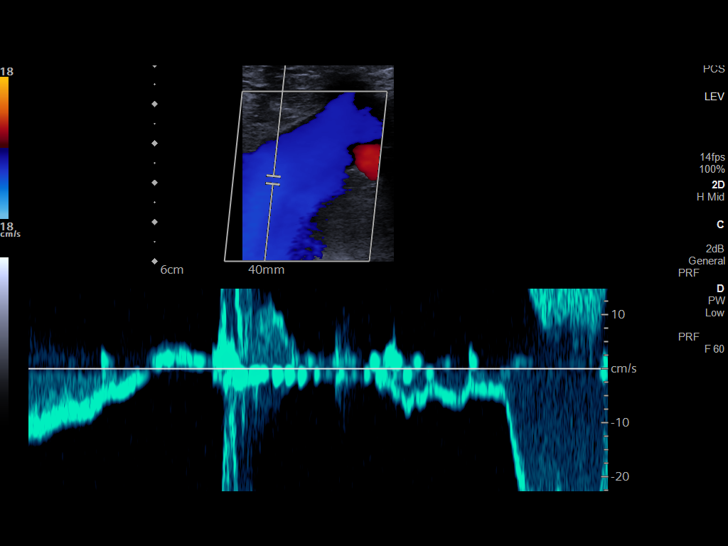
[im 11/42]
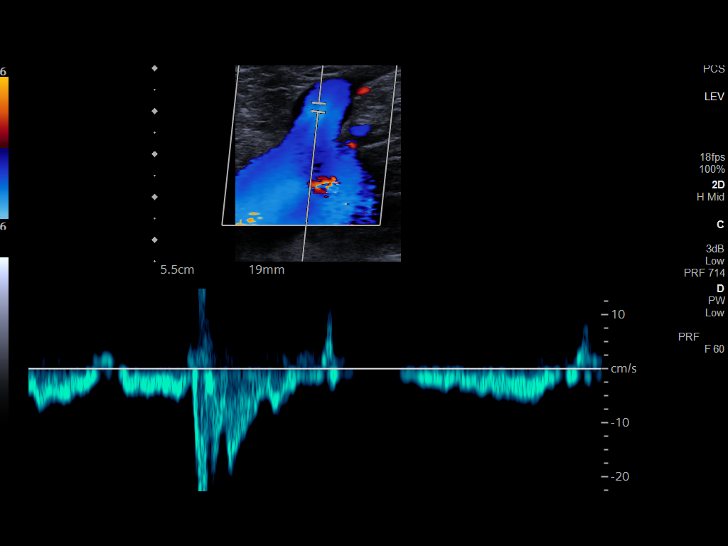
[im 15/42]
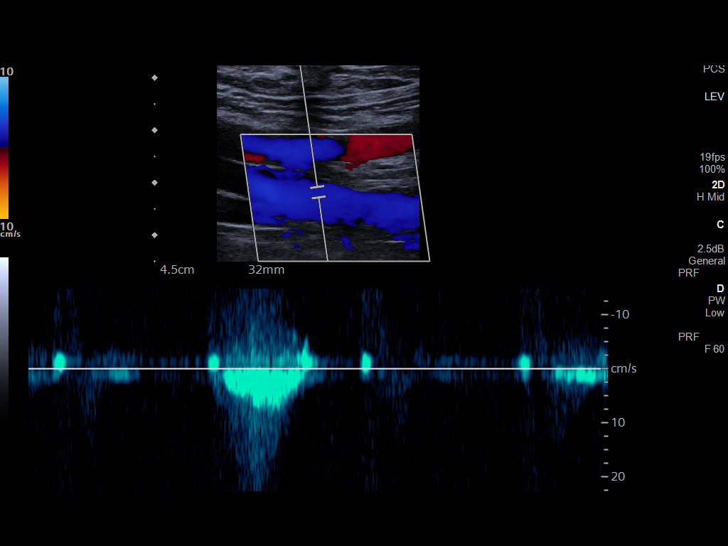
[im 18/42]
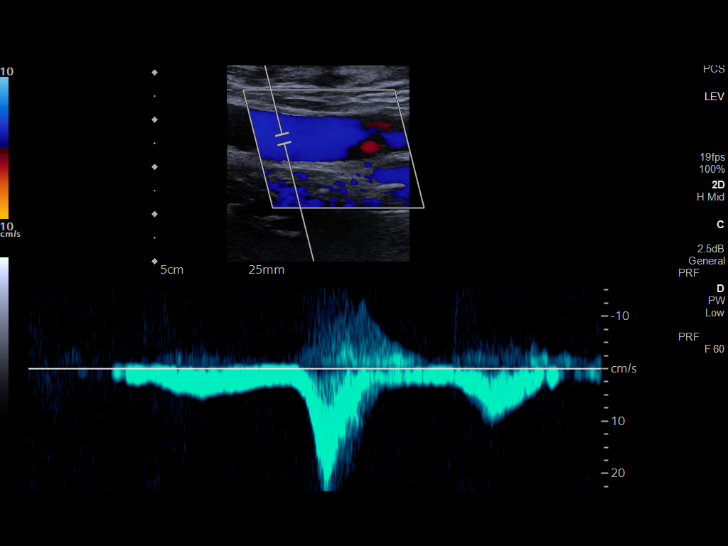
[im 22/42]
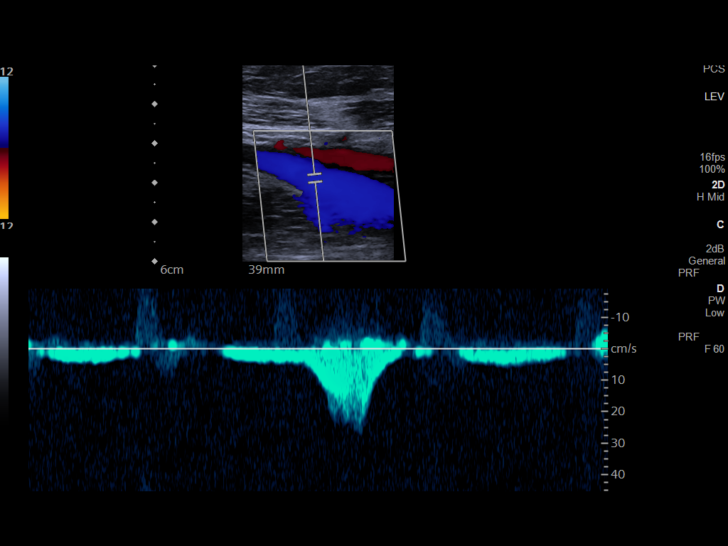
[im 24/42]
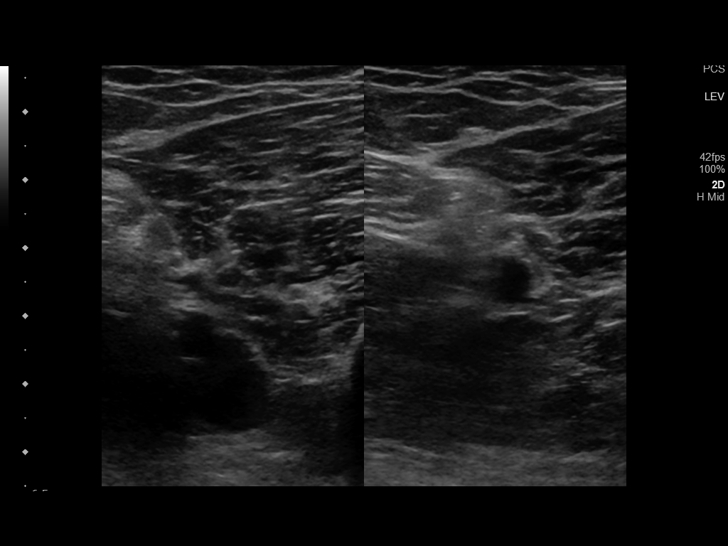
[im 27/42]
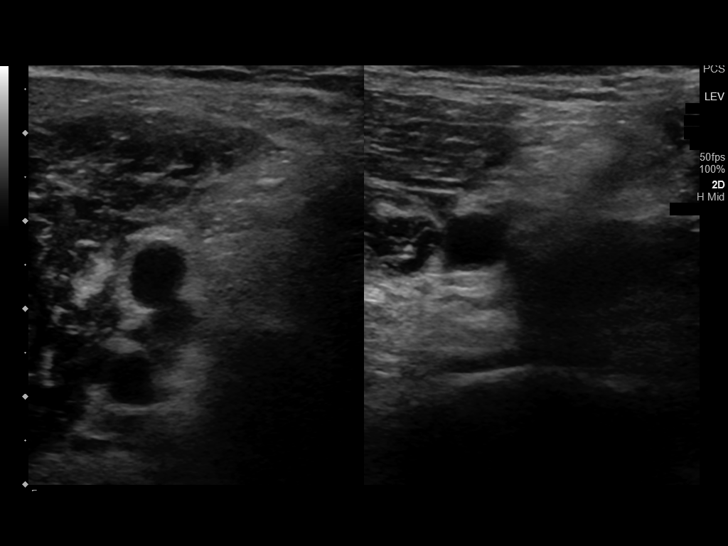
[im 31/42]
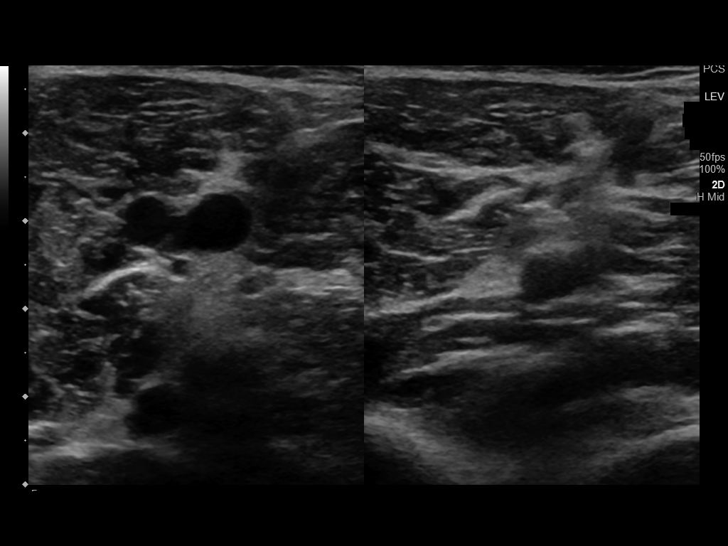
[im 34/42]
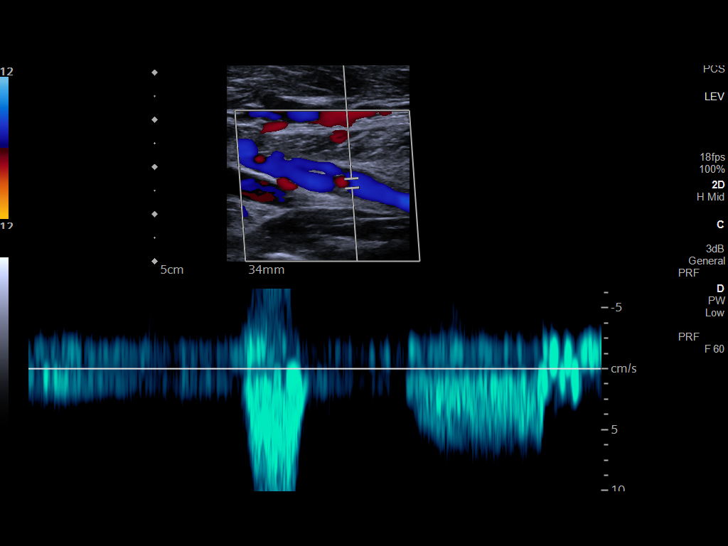
[im 38/42]
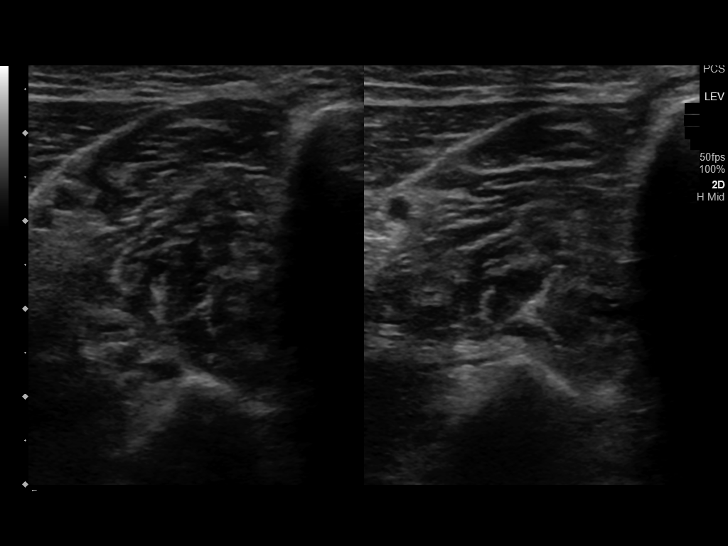
[im 42/42]
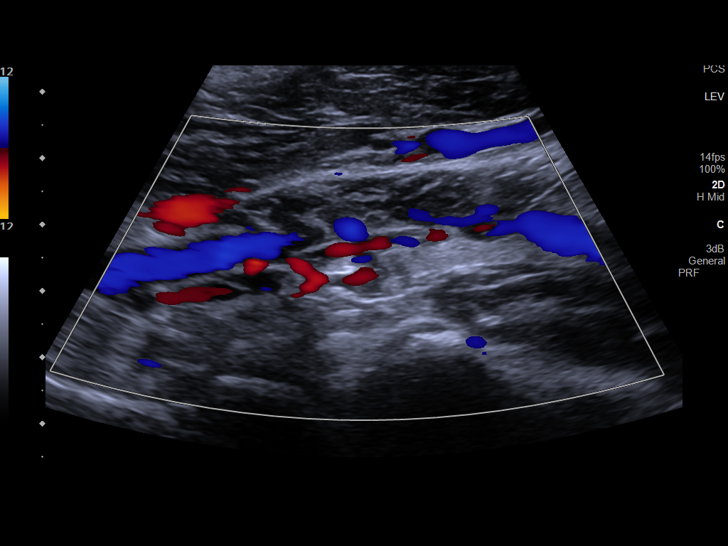

[13 of 24 positions shown; findings below may reference images not displayed]

FINDINGS: Contralateral Common Femoral Vein: Respiratory phasicity is normal
and symmetric with the symptomatic side. No evidence of thrombus.
Normal compressibility.

Common Femoral Vein: No evidence of thrombus. Normal
compressibility, respiratory phasicity and response to augmentation.

Saphenofemoral Junction: No evidence of thrombus. Normal
compressibility and flow on color Doppler imaging.

Profunda Femoral Vein: No evidence of thrombus. Normal
compressibility and flow on color Doppler imaging.

Femoral Vein: No evidence of thrombus. Normal compressibility,
respiratory phasicity and response to augmentation.

Popliteal Vein: Mild eccentric apparent filling defect within the
distal popliteal vein likely represents nonocclusive wall
thickening/chronic DVT with similar findings on the prior. Normal
compressibility. Flow is demonstrated within the popliteal vein on
color doppler imaging.

Calf Veins: No evidence of thrombus. Normal compressibility and flow
on color Doppler imaging.

Superficial Great Saphenous Vein: No evidence of thrombus. Normal
compressibility.

Venous Reflux:  None.

Other Findings:  None.
IMPRESSION: 1. No evidence of acute DVT within the left lower extremity.
2. Mild eccentric apparent filling defect within the distal
popliteal vein likely represents nonocclusive wall
thickening/chronic DVT with similar findings on the prior.

## 2022-05-17 ENCOUNTER — Encounter (INDEPENDENT_AMBULATORY_CARE_PROVIDER_SITE_OTHER): Payer: Self-pay
